# Patient Record
Sex: Male | Born: 1975 | Race: White | Hispanic: No | Marital: Married | Smoking: Current every day smoker
Health system: Southern US, Community
[De-identification: ages and names within clinical notes are randomized; demographics above are authoritative.]

## PROBLEM LIST (undated history)

## (undated) HISTORY — PX: NO PAST SURGERIES: SHX2092

---

## 2017-02-12 ENCOUNTER — Encounter: Payer: Self-pay | Admitting: Family Medicine

## 2017-02-12 ENCOUNTER — Ambulatory Visit (INDEPENDENT_AMBULATORY_CARE_PROVIDER_SITE_OTHER): Payer: BC Managed Care – PPO | Admitting: Family Medicine

## 2017-02-12 VITALS — BP 133/82 | HR 57 | Temp 98.4°F | Resp 20 | Ht 72.25 in | Wt 206.0 lb

## 2017-02-12 DIAGNOSIS — R030 Elevated blood-pressure reading, without diagnosis of hypertension: Secondary | ICD-10-CM | POA: Diagnosis not present

## 2017-02-12 DIAGNOSIS — Z7689 Persons encountering health services in other specified circumstances: Secondary | ICD-10-CM

## 2017-02-12 DIAGNOSIS — Z13220 Encounter for screening for lipoid disorders: Secondary | ICD-10-CM

## 2017-02-12 DIAGNOSIS — Z0000 Encounter for general adult medical examination without abnormal findings: Secondary | ICD-10-CM | POA: Diagnosis not present

## 2017-02-12 DIAGNOSIS — Z131 Encounter for screening for diabetes mellitus: Secondary | ICD-10-CM

## 2017-02-12 DIAGNOSIS — Z114 Encounter for screening for human immunodeficiency virus [HIV]: Secondary | ICD-10-CM | POA: Diagnosis not present

## 2017-02-12 NOTE — Patient Instructions (Signed)

## 2017-02-12 NOTE — Progress Notes (Signed)
Patient ID: [name], male  DOB: 1976-01-20, 41 y.o.   MRN: [id number] Patient Care Team    Relationship Specialty Notifications Start End  Ma Hillock, DO PCP - General Family Medicine  02/12/17     Chief Complaint  Patient presents with  . Establish Care    Subjective:  Richard Cutting is a 41 y.o.  male present for new patient establishment. All past medical history, surgical history, allergies, family history, immunizations, medications and social history were obtained and entered in the electronic medical record today. All recent labs, ED visits and hospitalizations within the last year were reviewed.  Patient has never been established with your primary care physician. He reports many muscle skeletal injuries, but no surgeries.  Health maintenance:  Colonoscopy: Fhx in mother in her 76s. Screen at 50.  Immunizations: tdap 2014 UTD, Influenza (encouraged yearly) Infectious disease screening: HIV agreeable today PSA: No Fhx, screen at 50 discussion.  Assistive device: None Oxygen use: None Patient has a Dental home. Hospitalizations/ED visits: None  Depression screen [facility] 2/9 02/12/2017  Decreased Interest 0  Down, Depressed, Hopeless 0  PHQ - 2 Score 0   No flowsheet data found.    Current Exercise Habits: The patient has a physically strenous job, but has no regular exercise apart from work. Exercise limited by: None identified Fall Risk  02/12/2017  Falls in the past year? No      There is no immunization history on file for this patient.  No exam data present  History reviewed. No pertinent past medical history. Allergies  Allergen Reactions  . Morphine And Related     anger   History reviewed. No pertinent surgical history. Family History  Problem Relation Age of Onset  . Ovarian cancer Mother   . Colon cancer Mother   . Heart disease Mother   . Bladder Cancer Father   . Hearing loss Father   . Heart disease Father   . Early death  Father   . Breast cancer Maternal Grandmother   . Arthritis Maternal Grandmother   . Mental retardation Maternal Grandmother   . Heart disease Maternal Grandmother   . Hearing loss Maternal Grandmother   . Alcohol abuse Maternal Grandfather   . Mental retardation Maternal Grandfather   . Early death Maternal Grandfather   . Arthritis Paternal Grandmother   . Heart disease Paternal Grandfather    Social History   Social History  . Marital status: Married    Spouse name: [name]  . Number of children: 2  . Years of education: tech school   Occupational History  . [employer] forwarder    Social History Main Topics  . Smoking status: Current Every Day Smoker    Packs/day: 1.00    Years: 26.00    Types: Cigarettes  . Smokeless tobacco: Never Used  . Alcohol use 1.2 oz/week    2 Cans of beer per week  . Drug use: No  . Sexual activity: Yes    Partners: Female   Other Topics Concern  . Not on file   Social History Narrative   Married to [place]. They have 3 children [name], [name].   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   Drinks caffeine, takes herbal remedies, takes a daily vitamin.   Wear seatbelt, smoke detector in the home.   Exercises routinely.   Has a partial plate upper front teeth.   Feels safe in his relationships.   Allergies as of  02/12/2017      Reactions   Morphine And Related    anger      Medication List    as of 02/12/2017 11:59 PM   You have not been prescribed any medications.     All past medical history, surgical history, allergies, family history, immunizations andmedications were updated in the EMR today and reviewed under the history and medication portions of their EMR.    No results found for this or any previous visit (from the past 2160 hour(s)).  Patient was never admitted.   ROS: 14 pt review of systems performed and negative (unless mentioned in an HPI)  Objective: BP 133/82 (BP Location: Right Arm, Patient  Position: Sitting, Cuff Size: Normal)   Pulse (!) 57   Temp 98.4 F (36.9 C)   Resp 20   Ht 6' 0.25" (1.835 m)   Wt 206 lb (93.4 kg)   SpO2 96%   BMI 27.75 kg/m  Gen: Afebrile. No acute distress. Nontoxic in appearance, well-developed, well-nourished, Pleasant Caucasian male. HENT: AT. [place]. Bilateral TM visualized and normal in appearance, normal external auditory canal. MMM, no oral lesions, adequate dentition. Bilateral nares within normal limits. Throat without erythema, ulcerations or exudates. No Cough on exam, no hoarseness on exam. Eyes:Pupils Equal Round Reactive to light, Extraocular movements intact,  Conjunctiva without redness, discharge or icterus. Neck/lymp/endocrine: Supple, no lymphadenopathy, no thyromegaly CV: RRR no murmur, no edema, +2/4 P posterior tibialis pulses. No carotid bruits. No JVD. Chest: CTAB, no wheeze, rhonchi or crackles. Normal Respiratory effort. Good Air movement. Abd: Soft. Flat. NTND. BS present. No Masses palpated. No hepatosplenomegaly. No rebound tenderness or guarding. Skin: No rashes, purpura or petechiae. Warm and well-perfused. Skin intact. Neuro/Msk:  Normal gait. PERLA. EOMi. Alert. Oriented x3.  Cranial nerves II through XII intact. Muscle strength 5/5 upper/lower extremity.  Psych: Normal affect, dress and demeanor. Normal speech. Normal thought content and judgment.   Assessment/plan: Alvis Edgell is a 41 y.o. male present for establish care.  Encounter for preventive health examination Patient was encouraged to exercise greater than 150 minutes a week. Patient was encouraged to choose a diet filled with fresh fruits and vegetables, and lean meats. AVS provided to patient today for education/recommendation on gender specific health and safety maintenance Elevated BP without diagnosis of hypertension - Patient has never checked blood pressure before. Initial blood pressure elevated at 152/93, repeat within normal range. - Monitor  sodium, exercise. - CBC w/Diff; Future - Comp Met (CMET); Future - Hemoglobin A1c; Future - TSH; Future Encounter for screening for HIV - Patient agreeable to testing today - HIV antibody (with reflex); Future Screening cholesterol level - Lipid panel; Future Diabetes mellitus screening - Hemoglobin A1c; Future . Return in about 1 year (around 02/12/2018) for CPE.   Note is dictated utilizing voice recognition software. Although note has been proof read prior to signing, occasional typographical errors still can be missed. If any questions arise, please do not hesitate to call for verification.  Electronically signed by: Howard Pouch, DO [place]

## 2017-02-13 ENCOUNTER — Encounter: Payer: Self-pay | Admitting: Family Medicine

## 2017-02-21 ENCOUNTER — Other Ambulatory Visit (INDEPENDENT_AMBULATORY_CARE_PROVIDER_SITE_OTHER): Payer: BC Managed Care – PPO

## 2017-02-21 DIAGNOSIS — R030 Elevated blood-pressure reading, without diagnosis of hypertension: Secondary | ICD-10-CM | POA: Diagnosis not present

## 2017-02-21 DIAGNOSIS — Z13220 Encounter for screening for lipoid disorders: Secondary | ICD-10-CM | POA: Diagnosis not present

## 2017-02-21 DIAGNOSIS — Z114 Encounter for screening for human immunodeficiency virus [HIV]: Secondary | ICD-10-CM

## 2017-02-21 DIAGNOSIS — Z131 Encounter for screening for diabetes mellitus: Secondary | ICD-10-CM

## 2017-02-21 LAB — CBC WITH DIFFERENTIAL/PLATELET
BASOS ABS: 0.1 10*3/uL (ref 0.0–0.1)
Basophils Relative: 0.8 % (ref 0.0–3.0)
EOS ABS: 0.2 10*3/uL (ref 0.0–0.7)
Eosinophils Relative: 3 % (ref 0.0–5.0)
HCT: 50.4 % (ref 39.0–52.0)
Hemoglobin: 16.9 g/dL (ref 13.0–17.0)
LYMPHS ABS: 1.8 10*3/uL (ref 0.7–4.0)
Lymphocytes Relative: 22.6 % (ref 12.0–46.0)
MCHC: 33.4 g/dL (ref 30.0–36.0)
MCV: 94 fl (ref 78.0–100.0)
Monocytes Absolute: 0.6 10*3/uL (ref 0.1–1.0)
Monocytes Relative: 7.7 % (ref 3.0–12.0)
NEUTROS ABS: 5.4 10*3/uL (ref 1.4–7.7)
NEUTROS PCT: 65.9 % (ref 43.0–77.0)
PLATELETS: 272 10*3/uL (ref 150.0–400.0)
RBC: 5.36 Mil/uL (ref 4.22–5.81)
RDW: 12.3 % (ref 11.5–15.5)
WBC: 8.1 10*3/uL (ref 4.0–10.5)

## 2017-02-21 LAB — COMPREHENSIVE METABOLIC PANEL
ALT: 22 U/L (ref 0–53)
AST: 17 U/L (ref 0–37)
Albumin: 4.9 g/dL (ref 3.5–5.2)
Alkaline Phosphatase: 65 U/L (ref 39–117)
BUN: 18 mg/dL (ref 6–23)
CHLORIDE: 105 meq/L (ref 96–112)
CO2: 30 mEq/L (ref 19–32)
CREATININE: 1.1 mg/dL (ref 0.40–1.50)
Calcium: 9.7 mg/dL (ref 8.4–10.5)
GFR: 78.27 mL/min (ref >60.00)
Glucose, Bld: 97 mg/dL (ref 70–99)
POTASSIUM: 5.6 meq/L — AB (ref 3.5–5.1)
SODIUM: 140 meq/L (ref 135–145)
TOTAL PROTEIN: 6.9 g/dL (ref 6.0–8.3)
Total Bilirubin: 0.6 mg/dL (ref 0.2–1.2)

## 2017-02-21 LAB — LIPID PANEL
CHOLESTEROL: 184 mg/dL (ref 0–200)
HDL: 43.7 mg/dL (ref >39.00)
LDL CALC: 120 mg/dL — AB (ref 0–99)
NonHDL: 140.15
TRIGLYCERIDES: 99 mg/dL (ref 0.0–149.0)
Total CHOL/HDL Ratio: 4
VLDL: 19.8 mg/dL (ref 0.0–40.0)

## 2017-02-21 LAB — TSH: TSH: 0.85 u[IU]/mL (ref 0.35–4.50)

## 2017-02-22 LAB — HEMOGLOBIN A1C
HEMOGLOBIN A1C: 4.7 % (ref <5.7)
Mean Plasma Glucose: 88 mg/dL

## 2017-02-22 LAB — HIV ANTIBODY (ROUTINE TESTING W REFLEX): HIV 1&2 Ab, 4th Generation: NONREACTIVE

## 2017-02-24 ENCOUNTER — Telehealth: Payer: Self-pay | Admitting: Family Medicine

## 2017-02-24 NOTE — Telephone Encounter (Signed)
Patient notified and verbalized understanding. 

## 2017-02-24 NOTE — Telephone Encounter (Signed)
Please call pt: - his labs look good. His potassium is mildly elevated. If he is taking a potassium supplement or using salt substitute (which can be high in potassium), please have him cut back on use.

## 2017-09-30 ENCOUNTER — Ambulatory Visit: Payer: BC Managed Care – PPO | Admitting: Family Medicine

## 2017-10-09 ENCOUNTER — Encounter: Payer: Self-pay | Admitting: Family Medicine

## 2017-10-09 ENCOUNTER — Ambulatory Visit: Payer: BC Managed Care – PPO | Admitting: Family Medicine

## 2017-10-09 VITALS — BP 117/73 | HR 62 | Temp 97.5°F | Ht 72.25 in | Wt 208.0 lb

## 2017-10-09 DIAGNOSIS — F17200 Nicotine dependence, unspecified, uncomplicated: Secondary | ICD-10-CM | POA: Diagnosis not present

## 2017-10-09 DIAGNOSIS — G8929 Other chronic pain: Secondary | ICD-10-CM | POA: Diagnosis not present

## 2017-10-09 DIAGNOSIS — Z716 Tobacco abuse counseling: Secondary | ICD-10-CM | POA: Diagnosis not present

## 2017-10-09 DIAGNOSIS — G4700 Insomnia, unspecified: Secondary | ICD-10-CM

## 2017-10-09 DIAGNOSIS — M25521 Pain in right elbow: Secondary | ICD-10-CM

## 2017-10-09 MED ORDER — BUPROPION HCL ER (SR) 150 MG PO TB12: ORAL_TABLET | ORAL | 0 refills | Status: DC

## 2017-10-09 NOTE — Progress Notes (Signed)
[name] , 07/14/1976, 42 y.o., male MRN: [id number] Patient Care Team    Relationship Specialty Notifications Start End  [name], Renee A, DO PCP - General Family Medicine  02/12/17     Chief Complaint  Patient presents with  . Nicotine Dependence    Pt is here to know how to quit smoking     Subjective: Pt presents for an OV with complaints of wanting to quit smoking. Patient reports he successfully quit the past without any medication assistance. However he is here today to be supportive because his significant other once to quit smoking as well.  Patient reports currently smokes about a half-pack a day sometimes up to 1 pack a day, full flavored. He attempts to use toothpicks and other behavior mechanisms to help him through cravings. He is agreeable to try a oral medication, but does not want to try nicotine gum or patches.  Right elbow tendinitis: Patient reports over the last 8 months he has had discomfort in his right elbow. He states it usually hurts to touch and to make some movements. He has tried to take Aleve which only helped very temporarily.  Depression screen PHQ 2/9 02/12/2017  Decreased Interest 0  Down, Depressed, Hopeless 0  PHQ - 2 Score 0    Allergies  Allergen Reactions  . Morphine And Related     anger   Social History   Tobacco Use  . Smoking status: Current Every Day Smoker    Packs/day: 1.00    Years: 26.00    Pack years: 26.00    Types: Cigarettes  . Smokeless tobacco: Never Used  Substance Use Topics  . Alcohol use: Yes    Alcohol/week: 1.2 oz    Types: 2 Cans of beer per week   History reviewed. No pertinent past medical history. History reviewed. No pertinent surgical history. Family History  Problem Relation Age of Onset  . Ovarian cancer Mother   . Colon cancer Mother   . Heart disease Mother   . Bladder Cancer Father   . Hearing loss Father   . Heart disease Father   . Early death Father   . Breast cancer Maternal  Grandmother   . Arthritis Maternal Grandmother   . Mental retardation Maternal Grandmother   . Heart disease Maternal Grandmother   . Hearing loss Maternal Grandmother   . Alcohol abuse Maternal Grandfather   . Mental retardation Maternal Grandfather   . Early death Maternal Grandfather   . Arthritis Paternal Grandmother   . Heart disease Paternal Grandfather    Allergies as of 10/09/2017      Reactions   Morphine And Related    anger      Medication List    as of 10/09/2017  9:48 AM   You have not been prescribed any medications.     All past medical history, surgical history, allergies, family history, immunizations andmedications were updated in the EMR today and reviewed under the history and medication portions of their EMR.     ROS: Negative, with the exception of above mentioned in HPI   Objective:  BP 117/73 (BP Location: Left Arm, Patient Position: Sitting, Cuff Size: Large)   Pulse 62   Temp (!) 97.5 F (36.4 C) (Oral)   Ht 6' 0.25" (1.835 m)   Wt 208 lb (94.3 kg)   SpO2 97%   BMI 28.01 kg/m  Body mass index is 28.01 kg/m. Gen: Afebrile. No acute distress. Nontoxic in appearance, well developed,  well nourished.  HENT: AT. [place].MMM, no oral lesions. Eyes:Pupils Equal Round Reactive to light, Extraocular movements intact,  Conjunctiva without redness, discharge or icterus. CV: RRR no murmur, no edema Chest: CTAB, no wheeze or crackles. Good air movement, normal resp effort.  MSK: No erythema, no soft tissue swelling. Mild tenderness to palpation right lateral epicondyle area of elbow. Mild reproduction of discomfort with resisted supination. Neurologically intact distally. Skin: No rashes, purpura or petechiae.  Psych: Normal affect, dress and demeanor. Normal speech. Normal thought content and judgment.  No exam data present No results found. No results found for this or any previous visit (from the past 24 hour(s)).  Assessment/Plan: [name] is a 42  y.o. male present for OV for  Tobacco use disorder Encounter for smoking cessation counseling Discussed multiple options for his smoking cessation. Patient was instructed on Zyban/Wellbutrin use. Taper over 3 days to twice a day. During first 2 weeks sees patient should be cutting back on his daily cigarette consumption. Approximately on day 14 of medication use should be his quit date. Expect to use medication for 3 months. If patient still using medication at 4 weeks, he will call and for refills at that time. Okay to refill for #60, 1 refill.   Insomnia, unspecified type Briefly discussed today. He has tried over-the-counter sleep aids. Patient encouraged to see how Wellbutrin affects his sleep pattern. If needing to discuss insomnia in more detail he needs to follow-up with an appointment for this issue.  Elbow pain, chronic, right Mild tendinitis on exam today. Encourage the use of a tendinitis elbow strap. Aleve as needed. Discuss with him if does not respond to this then he would need a sports medicine referral and consider cortisone injection.   Reviewed expectations re: course of current medical issues.  Discussed self-management of symptoms.  Outlined signs and symptoms indicating need for more acute intervention.  Patient verbalized understanding and all questions were answered.  Patient received an After-Visit Summary.    No orders of the defined types were placed in this encounter.    Note is dictated utilizing voice recognition software. Although note has been proof read prior to signing, occasional typographical errors still can be missed. If any questions arise, please do not hesitate to call for verification.   electronically signed by:  Felix Pacini, DO  [facility] [place]

## 2017-10-09 NOTE — Patient Instructions (Addendum)
Start zyban once a day for 3 days, then every 12 hours. In 2 weeks stop all smoking., throw them out.  If still using in 4 weeks, call in and we will refill for you.  Recommendations are 12 weeks.     Smoking Tobacco Information Smoking tobacco will very likely harm your health. Tobacco contains a poisonous (toxic), colorless chemical called nicotine. Nicotine affects the brain and makes tobacco addictive. This change in your brain can make it hard to stop smoking. Tobacco also has other toxic chemicals that can hurt your body and raise your risk of many cancers. How can smoking tobacco affect me? Smoking tobacco can increase your chances of having serious health conditions, such as:  Cancer. Smoking is most commonly associated with lung cancer, but can lead to cancer in other parts of the body.  Chronic obstructive pulmonary disease (COPD). This is a long-term lung condition that makes it hard to breathe. It also gets worse over time.  High blood pressure (hypertension), heart disease, stroke, or heart attack.  Lung infections, such as pneumonia.  Cataracts. This is when the lenses in the eyes become clouded.  Digestive problems. This may include peptic ulcers, heartburn, and gastroesophageal reflux disease (GERD).  Oral health problems, such as gum disease and tooth loss.  Loss of taste and smell.  Smoking can affect your appearance by causing:  Wrinkles.  Yellow or stained teeth, fingers, and fingernails.  Smoking tobacco can also affect your social life.  Many workplaces, [employer], hotels, and public places are tobacco-free. This means that you may experience challenges in finding places to smoke when away from home.  The cost of a smoking habit can be expensive. Expenses for someone who smokes come in two ways: ? You spend money on a regular basis to buy tobacco. ? Your health care costs in the long-term are higher if you smoke.  Tobacco smoke can also affect the  health of those around you. Children of smokers have greater chances of: ? Sudden infant death syndrome (SIDS). ? Ear infections. ? Lung infections.  What lifestyle changes can be made?  Do not start smoking. Quit if you already do.  To quit smoking: ? Make a plan to quit smoking and commit yourself to it. Look for programs to help you and ask your health care provider for recommendations and ideas. ? Talk with your health care provider about using nicotine replacement medicines to help you quit. Medicine replacement medicines include gum, lozenges, patches, sprays, or pills. ? Do not replace cigarette smoking with electronic cigarettes, which are commonly called e-cigarettes. The safety of e-cigarettes is not known, and some may contain harmful chemicals. ? Avoid places, people, or situations that tempt you to smoke. ? If you try to quit but return to smoking, don't give up hope. It is very common for people to try a number of times before they fully succeed. When you feel ready again, give it another try.  Quitting smoking might affect the way you eat as well as your weight. Be prepared to monitor your eating habits. Get support in planning and following a healthy diet.  Ask your health care provider about having regular tests (screenings) to check for cancer. This may include blood tests, imaging tests, and other tests.  Exercise regularly. Consider taking walks, joining a gym, or doing yoga or exercise classes.  Develop skills to manage your stress. These skills include meditation. What are the benefits of quitting smoking? By quitting smoking, you may:  Lower your risk of getting cancer and other diseases caused by smoking.  Live longer.  Breathe better.  Lower your blood pressure and heart rate.  Stop your addiction to tobacco.  Stop creating secondhand smoke that hurts other people.  Improve your sense of taste and smell.  Look better over time, due to having fewer  wrinkles and less staining.  What can happen if changes are not made? If you do not stop smoking, you may:  Get cancer and other diseases.  Develop COPD or other long-term (chronic) lung conditions.  Develop serious problems with your heart and blood vessels (cardiovascular system).  Need more tests to screen for problems caused by smoking.  Have higher, long-term healthcare costs from medicines or treatments related to smoking.  Continue to have worsening changes in your lungs, mouth, and nose.  Where to find support: To get support to quit smoking, consider:  Asking your health care provider for more information and resources.  Taking classes to learn more about quitting smoking.  Looking for local organizations that offer resources about quitting smoking.  Joining a support group for people who want to quit smoking in your local community.  Where to find more information: You may find more information about quitting smoking from:  HelpGuide.org: www.helpguide.org/articles/addictions/how-to-quit-smoking.htm  [employer]: smokefree.gov  American Lung Association: www.lung.org  Contact a health care provider if:  You have problems breathing.  Your lips, nose, or fingers turn blue.  You have chest pain.  You are coughing up blood.  You feel faint or you pass out.  You have other noticeable changes that cause you to worry. Summary  Smoking tobacco can negatively affect your health, the health of those around you, your finances, and your social life.  Do not start smoking. Quit if you already do. If you need help quitting, ask your health care provider.  Think about joining a support group for people who want to quit smoking in your local community. There are many effective programs that will help you to quit this behavior. This information is not intended to replace advice given to you by your health care provider. Make sure you discuss any questions you have  with your health care provider. Document Released: 08/06/2016 Document Revised: 08/06/2016 Document Reviewed: 08/06/2016 Elsevier Interactive Patient Education  2018 [employer]. Insomnia Insomnia is a sleep disorder that makes it difficult to fall asleep or to stay asleep. Insomnia can cause tiredness (fatigue), low energy, difficulty concentrating, mood swings, and poor performance at work or school. There are three different ways to classify insomnia:  Difficulty falling asleep.  Difficulty staying asleep.  Waking up too early in the morning.  Any type of insomnia can be long-term (chronic) or short-term (acute). Both are common. Short-term insomnia usually lasts for three months or less. Chronic insomnia occurs at least three times a week for longer than three months. What are the causes? Insomnia may be caused by another condition, situation, or substance, such as:  Anxiety.  Certain medicines.  Gastroesophageal reflux disease (GERD) or other gastrointestinal conditions.  Asthma or other breathing conditions.  Restless legs syndrome, sleep apnea, or other sleep disorders.  Chronic pain.  Menopause. This may include hot flashes.  Stroke.  Abuse of alcohol, tobacco, or illegal drugs.  Depression.  Caffeine.  Neurological disorders, such as Alzheimer disease.  An overactive thyroid (hyperthyroidism).  The cause of insomnia may not be known. What increases the risk? Risk factors for insomnia include:  Gender. Women are more commonly  affected than men.  Age. Insomnia is more common as you get older.  Stress. This may involve your professional or personal life.  Income. Insomnia is more common in people with lower income.  Lack of exercise.  Irregular work schedule or night shifts.  Traveling between different time zones.  What are the signs or symptoms? If you have insomnia, trouble falling asleep or trouble staying asleep is the main symptom. This may  lead to other symptoms, such as:  Feeling fatigued.  Feeling nervous about going to sleep.  Not feeling rested in the morning.  Having trouble concentrating.  Feeling irritable, anxious, or depressed.  How is this treated? Treatment for insomnia depends on the cause. If your insomnia is caused by an underlying condition, treatment will focus on addressing the condition. Treatment may also include:  Medicines to help you sleep.  Counseling or therapy.  Lifestyle adjustments.  Follow these instructions at home:  Take medicines only as directed by your health care provider.  Keep regular sleeping and waking hours. Avoid naps.  Keep a sleep diary to help you and your health care provider figure out what could be causing your insomnia. Include: ? When you sleep. ? When you wake up during the night. ? How well you sleep. ? How rested you feel the next day. ? Any side effects of medicines you are taking. ? What you eat and drink.  Make your bedroom a comfortable place where it is easy to fall asleep: ? Put up shades or special blackout curtains to block light from outside. ? Use a white noise machine to block noise. ? Keep the temperature cool.  Exercise regularly as directed by your health care provider. Avoid exercising right before bedtime.  Use relaxation techniques to manage stress. Ask your health care provider to suggest some techniques that may work well for you. These may include: ? Breathing exercises. ? Routines to release muscle tension. ? Visualizing peaceful scenes.  Cut back on alcohol, caffeinated beverages, and cigarettes, especially close to bedtime. These can disrupt your sleep.  Do not overeat or eat spicy foods right before bedtime. This can lead to digestive discomfort that can make it hard for you to sleep.  Limit screen use before bedtime. This includes: ? Watching TV. ? Using your smartphone, tablet, and computer.  Stick to a routine. This can  help you fall asleep faster. Try to do a quiet activity, brush your teeth, and go to bed at the same time each night.  Get out of bed if you are still awake after 15 minutes of trying to sleep. Keep the lights down, but try reading or doing a quiet activity. When you feel sleepy, go back to bed.  Make sure that you drive carefully. Avoid driving if you feel very sleepy.  Keep all follow-up appointments as directed by your health care provider. This is important. Contact a health care provider if:  You are tired throughout the day or have trouble in your daily routine due to sleepiness.  You continue to have sleep problems or your sleep problems get worse. Get help right away if:  You have serious thoughts about hurting yourself or someone else. This information is not intended to replace advice given to you by your health care provider. Make sure you discuss any questions you have with your health care provider. Document Released: 07/19/2000 Document Revised: 12/22/2015 Document Reviewed: 04/22/2014 Elsevier Interactive Patient Education  2018 [employer]  Tennis Elbow Tennis elbow is  puffiness (inflammation) of the outer tendons of your forearm close to your elbow. Your tendons attach your muscles to your bones. Tennis elbow can happen in any sport or job in which you use your elbow too much. It is caused by doing the same motion over and over. Tennis elbow can cause:  Pain and tenderness in your forearm and the outer part of your elbow.  A burning feeling. This runs from your elbow through your arm.  Weak grip in your hands.  Follow these instructions at home: Activity  Rest your elbow and wrist as told by your doctor. Try to avoid any activities that caused the problem until your doctor says that you can do them again.  If a physical therapist teaches you exercises, do all of them as told.  If you lift an object, lift it with your palm facing up. This is easier on your  elbow. Lifestyle  If your tennis elbow is caused by sports, check your equipment and make sure that: ? You are using it correctly. ? It fits you well.  If your tennis elbow is caused by work, take breaks often, if you are able. Talk with your manager about doing your work in a way that is safe for you. ? If your tennis elbow is caused by computer use, talk with your manager about any changes that can be made to your work setup. General instructions  If told, apply ice to the painful area: ? Put ice in a plastic bag. ? Place a towel between your skin and the bag. ? Leave the ice on for 20 minutes, 2-3 times per day.  Take medicines only as told by your doctor.  If you were given a brace, wear it as told by your doctor.  Keep all follow-up visits as told by your doctor. This is important. Contact a doctor if:  Your pain does not get better with treatment.  Your pain gets worse.  You have weakness in your forearm, hand, or fingers.  You cannot feel your forearm, hand, or fingers. This information is not intended to replace advice given to you by your health care provider. Make sure you discuss any questions you have with your health care provider. Document Released: 01/09/2010 Document Revised: 03/21/2016 Document Reviewed: 07/18/2014 Elsevier Interactive Patient Education  [employer].

## 2017-10-10 ENCOUNTER — Encounter: Payer: Self-pay | Admitting: Family Medicine

## 2017-11-10 ENCOUNTER — Other Ambulatory Visit: Payer: Self-pay | Admitting: *Deleted

## 2017-11-10 MED ORDER — BUPROPION HCL ER (SR) 150 MG PO TB12: 150.0000 mg | ORAL_TABLET | Freq: Two times a day (BID) | ORAL | 1 refills | Status: DC

## 2018-02-13 ENCOUNTER — Ambulatory Visit (INDEPENDENT_AMBULATORY_CARE_PROVIDER_SITE_OTHER): Payer: BC Managed Care – PPO | Admitting: Family Medicine

## 2018-02-13 ENCOUNTER — Encounter: Payer: Self-pay | Admitting: Family Medicine

## 2018-02-13 VITALS — BP 120/78 | HR 58 | Temp 98.0°F | Resp 16 | Ht 72.0 in | Wt 200.0 lb

## 2018-02-13 DIAGNOSIS — E875 Hyperkalemia: Secondary | ICD-10-CM | POA: Insufficient documentation

## 2018-02-13 DIAGNOSIS — Z131 Encounter for screening for diabetes mellitus: Secondary | ICD-10-CM

## 2018-02-13 DIAGNOSIS — R0789 Other chest pain: Secondary | ICD-10-CM

## 2018-02-13 DIAGNOSIS — Z130 Encounter for screening for diseases of the blood and blood-forming organs and certain disorders involving the immune mechanism: Secondary | ICD-10-CM

## 2018-02-13 DIAGNOSIS — E663 Overweight: Secondary | ICD-10-CM | POA: Insufficient documentation

## 2018-02-13 DIAGNOSIS — Z0000 Encounter for general adult medical examination without abnormal findings: Secondary | ICD-10-CM | POA: Diagnosis not present

## 2018-02-13 DIAGNOSIS — F17200 Nicotine dependence, unspecified, uncomplicated: Secondary | ICD-10-CM | POA: Diagnosis not present

## 2018-02-13 LAB — BASIC METABOLIC PANEL
BUN: 15 mg/dL (ref 6–23)
CALCIUM: 9.4 mg/dL (ref 8.4–10.5)
CO2: 29 mEq/L (ref 19–32)
CREATININE: 1.06 mg/dL (ref 0.40–1.50)
Chloride: 105 mEq/L (ref 96–112)
GFR: 81.3 mL/min (ref >60.00)
GLUCOSE: 99 mg/dL (ref 70–99)
Potassium: 5.1 mEq/L (ref 3.5–5.1)
Sodium: 140 mEq/L (ref 135–145)

## 2018-02-13 LAB — LIPID PANEL
CHOL/HDL RATIO: 4
Cholesterol: 193 mg/dL (ref 0–200)
HDL: 43.8 mg/dL (ref >39.00)
LDL Cholesterol: 129 mg/dL — ABNORMAL HIGH (ref 0–99)
NONHDL: 149.19
Triglycerides: 103 mg/dL (ref 0.0–149.0)
VLDL: 20.6 mg/dL (ref 0.0–40.0)

## 2018-02-13 LAB — CBC
HEMATOCRIT: 48.3 % (ref 39.0–52.0)
Hemoglobin: 16.6 g/dL (ref 13.0–17.0)
MCHC: 34.4 g/dL (ref 30.0–36.0)
MCV: 91.2 fl (ref 78.0–100.0)
PLATELETS: 277 10*3/uL (ref 150.0–400.0)
RBC: 5.29 Mil/uL (ref 4.22–5.81)
RDW: 12.6 % (ref 11.5–15.5)
WBC: 9.1 10*3/uL (ref 4.0–10.5)

## 2018-02-13 LAB — HEMOGLOBIN A1C: HEMOGLOBIN A1C: 5.4 % (ref 4.6–6.5)

## 2018-02-13 NOTE — Patient Instructions (Addendum)
Health Maintenance, Male A healthy lifestyle and preventive care is important for your health and wellness. Ask your health care provider about what schedule of regular examinations is right for you. What should I know about weight and diet? Eat a Healthy Diet  Eat plenty of vegetables, fruits, whole grains, low-fat dairy products, and lean protein.  Do not eat a lot of foods high in solid fats, added sugars, or salt.  Maintain a Healthy Weight Regular exercise can help you achieve or maintain a healthy weight. You should:  Do at least 150 minutes of exercise each week. The exercise should increase your heart rate and make you sweat (moderate-intensity exercise).  Do strength-training exercises at least twice a week.  Watch Your Levels of Cholesterol and Blood Lipids  Have your blood tested for lipids and cholesterol every 5 years starting at 42 years of age. If you are at high risk for heart disease, you should start having your blood tested when you are 42 years old. You may need to have your cholesterol levels checked more often if: ? Your lipid or cholesterol levels are high. ? You are older than 42 years of age. ? You are at high risk for heart disease.  What should I know about cancer screening? Many types of cancers can be detected early and may often be prevented. Lung Cancer  You should be screened every year for lung cancer if: ? You are a current smoker who has smoked for at least 30 years. ? You are a former smoker who has quit within the past 15 years.  Talk to your health care provider about your screening options, when you should start screening, and how often you should be screened.  Colorectal Cancer  Routine colorectal cancer screening usually begins at 42 years of age and should be repeated every 5-10 years until you are 42 years old. You may need to be screened more often if early forms of precancerous polyps or small growths are found. Your health care provider  may recommend screening at an earlier age if you have risk factors for colon cancer.  Your health care provider may recommend using home test kits to check for hidden blood in the stool.  A small camera at the end of a tube can be used to examine your colon (sigmoidoscopy or colonoscopy). This checks for the earliest forms of colorectal cancer.  Prostate and Testicular Cancer  Depending on your age and overall health, your health care provider may do certain tests to screen for prostate and testicular cancer.  Talk to your health care provider about any symptoms or concerns you have about testicular or prostate cancer.  Skin Cancer  Check your skin from head to toe regularly.  Tell your health care provider about any new moles or changes in moles, especially if: ? There is a change in a mole's size, shape, or color. ? You have a mole that is larger than a pencil eraser.  Always use sunscreen. Apply sunscreen liberally and repeat throughout the day.  Protect yourself by wearing long sleeves, pants, a wide-brimmed hat, and sunglasses when outside.  What should I know about heart disease, diabetes, and high blood pressure?  If you are 18-39 years of age, have your blood pressure checked every 3-5 years. If you are 40 years of age or older, have your blood pressure checked every year. You should have your blood pressure measured twice-once when you are at a hospital or clinic, and once when   you are not at a hospital or clinic. Record the average of the two measurements. To check your blood pressure when you are not at a hospital or clinic, you can use: ? An automated blood pressure machine at a pharmacy. ? A home blood pressure monitor.  Talk to your health care provider about your target blood pressure.  If you are between 45-79 years old, ask your health care provider if you should take aspirin to prevent heart disease.  Have regular diabetes screenings by checking your fasting blood  sugar level. ? If you are at a normal weight and have a low risk for diabetes, have this test once every three years after the age of 45. ? If you are overweight and have a high risk for diabetes, consider being tested at a younger age or more often.  A one-time screening for abdominal aortic aneurysm (AAA) by ultrasound is recommended for men aged 65-75 years who are current or former smokers. What should I know about preventing infection? Hepatitis B If you have a higher risk for hepatitis B, you should be screened for this virus. Talk with your health care provider to find out if you are at risk for hepatitis B infection. Hepatitis C Blood testing is recommended for:  Everyone born from 1945 through 1965.  Anyone with known risk factors for hepatitis C.  Sexually Transmitted Diseases (STDs)  You should be screened each year for STDs including gonorrhea and chlamydia if: ? You are sexually active and are younger than 42 years of age. ? You are older than 42 years of age and your health care provider tells you that you are at risk for this type of infection. ? Your sexual activity has changed since you were last screened and you are at an increased risk for chlamydia or gonorrhea. Ask your health care provider if you are at risk.  Talk with your health care provider about whether you are at high risk of being infected with HIV. Your health care provider may recommend a prescription medicine to help prevent HIV infection.  What else can I do?  Schedule regular health, dental, and eye exams.  Stay current with your vaccines (immunizations).  Do not use any tobacco products, such as cigarettes, chewing tobacco, and e-cigarettes. If you need help quitting, ask your health care provider.  Limit alcohol intake to no more than 2 drinks per day. One drink equals 12 ounces of beer, 5 ounces of wine, or 1 ounces of hard liquor.  Do not use street drugs.  Do not share needles.  Ask your  health care provider for help if you need support or information about quitting drugs.  Tell your health care provider if you often feel depressed.  Tell your health care provider if you have ever been abused or do not feel safe at home. This information is not intended to replace advice given to you by your health care provider. Make sure you discuss any questions you have with your health care provider. Document Released: 01/18/2008 Document Revised: 03/20/2016 Document Reviewed: 04/25/2015 Elsevier Interactive Patient Education  2018 Elsevier Inc[employer]    Please help us help you:  We are honored you have chosen [facility] for your Primary Care home. Below you will find basic instructions that you may need to access in the future. Please help us help you by reading the instructions, which cover many of the frequent questions we experience.   Prescription refills and request:  -In order   to allow more efficient response time, please call your pharmacy for all refills. They will forward the request electronically to us. This allows for the quickest possible response. Request left on a nurse line can take longer to refill, since these are checked as time allows between office patients and other phone calls.  - refill request can take up to 3-5 working days to complete.  - If request is sent electronically and request is appropiate, it is usually completed in 1-2 business days.  - all patients will need to be seen routinely for all chronic medical conditions requiring prescription medications (see follow-up below). If you are overdue for follow up on your condition, you will be asked to make an appointment and we will call in enough medication to cover you until your appointment (up to 30 days).  - all controlled substances will require a face to face visit to request/refill.  - if you desire your prescriptions to go through a new pharmacy, and have an active script at original pharmacy, you will  need to call your pharmacy and have scripts transferred to new pharmacy. This is completed between the pharmacy locations and not by your provider.    Results: If any images or labs were ordered, it can take up to 1 week to get results depending on the test ordered and the lab/facility running and resulting the test. - Normal or stable results, which do not need further discussion, may be released to your mychart immediately with attached note to you. A call may not be generated for normal results. Please make certain to sign up for mychart. If you have questions on how to activate your mychart you can call the front office.  - If your results need further discussion, our office will attempt to contact you via phone, and if unable to reach you after 2 attempts, we will release your abnormal result to your mychart with instructions.  - All results will be automatically released in mychart after 1 week.  - Your provider will provide you with explanation and instruction on all relevant material in your results. Please keep in mind, results and labs may appear confusing or abnormal to the untrained eye, but it does not mean they are actually abnormal for you personally. If you have any questions about your results that are not covered, or you desire more detailed explanation than what was provided, you should make an appointment with your provider to do so.   Our office handles many outgoing and incoming calls daily. If we have not contacted you within 1 week about your results, please check your mychart to see if there is a message first and if not, then contact our office.  In helping with this matter, you help decrease call volume, and therefore allow us to be able to respond to patients needs more efficiently.   Acute office visits (sick visit):  An acute visit is intended for a new problem and are scheduled in shorter time slots to allow schedule openings for patients with new problems. This is the  appropriate visit to discuss a new problem. Problems will not be addressed by phone call or Echart message. Appointment is needed if requesting treatment. In order to provide you with excellent quality medical care with proper time for you to explain your problem, have an exam and receive treatment with instructions, these appointments should be limited to one new problem per visit. If you experience a new problem, in which you desire to be   addressed, please make an acute office visit, we save openings on the schedule to accommodate you. Please do not save your new problem for any other type of visit, let us take care of it properly and quickly for you.   Follow up visits:  Depending on your condition(s) your provider will need to see you routinely in order to provide you with quality care and prescribe medication(s). Most chronic conditions (Example: hypertension, Diabetes, depression/anxiety... etc), require visits a couple times a year. Your provider will instruct you on proper follow up for your personal medical conditions and history. Please make certain to make follow up appointments for your condition as instructed. Failing to do so could result in lapse in your medication treatment/refills. If you request a refill, and are overdue to be seen on a condition, we will always provide you with a 30 day script (once) to allow you time to schedule.    Medicare wellness (well visit): - we have a wonderful Nurse (Kim), that will meet with you and provide you will yearly medicare wellness visits. These visits should occur yearly (can not be scheduled less than 1 calendar year apart) and cover preventive health, immunizations, advance directives and screenings you are entitled to yearly through your medicare benefits. Do not miss out on your entitled benefits, this is when medicare will pay for these benefits to be ordered for you.  These are strongly encouraged by your provider and is the appropriate type of  visit to make certain you are up to date with all preventive health benefits. If you have not had your medicare wellness exam in the last 12 months, please make certain to schedule one by calling the office and schedule your medicare wellness with Kim as soon as possible.   Yearly physical (well visit):  - Adults are recommended to be seen yearly for physicals. Check with your insurance and date of your last physical, most insurances require one calendar year between physicals. Physicals include all preventive health topics, screenings, medical exam and labs that are appropriate for gender/age and history. You may have fasting labs needed at this visit. This is a well visit (not a sick visit), new problems should not be covered during this visit (see acute visit).  - Pediatric patients are seen more frequently when they are younger. Your provider will advise you on well child visit timing that is appropriate for your their age. - This is not a medicare wellness visit. Medicare wellness exams do not have an exam portion to the visit. Some medicare companies allow for a physical, some do not allow a yearly physical. If your medicare allows a yearly physical you can schedule the medicare wellness with our nurse Kim and have your physical with your provider after, on the same day. Please check with insurance for your full benefits.   Late Policy/No Shows:  - all new patients should arrive 15-30 minutes earlier than appointment to allow us time  to  obtain all personal demographics,  insurance information and for you to complete office paperwork. - All established patients should arrive 10-15 minutes earlier than appointment time to update all information and be checked in .  - In our best efforts to run on time, if you are late for your appointment you will be asked to either reschedule or if able, we will work you back into the schedule. There will be a wait time to work you back in the schedule,  depending  on availability.  - If   you are unable to make it to your appointment as scheduled, please call 24 hours ahead of time to allow us to fill the time slot with someone else who needs to be seen. If you do not cancel your appointment ahead of time, you may be charged a no show fee.  

## 2018-02-13 NOTE — Progress Notes (Signed)
Patient ID: Harvard Zeiss, male  DOB: [birth year], 42 y.o.   MRN: [id number] Patient Care Team    Relationship Specialty Notifications Start End  Natalia Leatherwood, DO PCP - General Family Medicine  02/12/17     Chief Complaint  Patient presents with  . Annual Exam    Subjective:  [name] is a 42 y.o. male present for CPE. All past medical history, surgical history, allergies, family history, immunizations, medications and social history were updated in the electronic medical record today. All recent labs, ED visits and hospitalizations within the last year were reviewed.  Chest discomfort: pt reports intermittent chest discomfort, right side of his chest, sometimes in his right arm. He has had in the past as well. No left sided symptom, dyspnea on exertion, diaphoresis or nausea associated. He states it will be TTP on his right chest. His has Fhx heart disease, he is a smoker.   Health maintenance: updated 02/13/18 Colonoscopy: Fhx in mother in her 17s. Screen at 50.  Immunizations: tdap 2014 UTD, Influenza (encouraged yearly) Infectious disease screening: HIV acompleted PSA: No Fhx, screen at 50 discussion.  Assistive device: None Oxygen use: None Patient has a Dental home. Hospitalizations/ED visits: None  Depression screen [facility] 2/9 02/13/2018 02/13/2018 02/12/2017  Decreased Interest 0 0 0  Down, Depressed, Hopeless 0 0 0  PHQ - 2 Score 0 0 0  Altered sleeping 1 - -  Change in appetite 0 - -  Feeling bad or failure about yourself  0 - -  Trouble concentrating 0 - -  Moving slowly or fidgety/restless 0 - -  Suicidal thoughts 0 - -  PHQ-9 Score 1 - -  Difficult doing work/chores Not difficult at all - -   GAD 7 : Generalized Anxiety Score 02/13/2018  Nervous, Anxious, on Edge 1  Control/stop worrying 0  Worry too much - different things 0  Trouble relaxing 1  Restless 2  Easily annoyed or irritable 1  Afraid - awful might happen 0  Total GAD 7 Score 5    Anxiety Difficulty Not difficult at all     Current Exercise Habits: Home exercise routine, Type of exercise: strength training/weights;treadmill, Time (Minutes): 20, Frequency (Times/Week): 7, Weekly Exercise (Minutes/Week): 140, Intensity: Moderate Exercise limited by: cardiac condition(s) Fall Risk  02/13/2018 02/12/2017  Falls in the past year? No No    There is no immunization history on file for this patient.   History reviewed. No pertinent past medical history. Allergies  Allergen Reactions  . Morphine And Related     anger   Past Surgical History:  Procedure Laterality Date  . NO PAST SURGERIES     Family History  Problem Relation Age of Onset  . Ovarian cancer Mother   . Colon cancer Mother   . Heart disease Mother   . Bladder Cancer Father   . Hearing loss Father   . Heart disease Father   . Early death Father   . Breast cancer Maternal Grandmother   . Arthritis Maternal Grandmother   . Mental retardation Maternal Grandmother   . Heart disease Maternal Grandmother   . Hearing loss Maternal Grandmother   . Alcohol abuse Maternal Grandfather   . Mental retardation Maternal Grandfather   . Early death Maternal Grandfather   . Arthritis Paternal Grandmother   . Heart disease Paternal Grandfather    Social History   Socioeconomic History  . Marital status: Married    Spouse name: Fleet Contras  .  Number of children: 2  . Years of education: tech school  . Highest education level: Not on file  Occupational History  . Occupation: Scientist, research (medical)  Social Needs  . Financial resource strain: Not on file  . Food insecurity:    Worry: Not on file    Inability: Not on file  . Transportation needs:    Medical: Not on file    Non-medical: Not on file  Tobacco Use  . Smoking status: Current Every Day Smoker    Packs/day: 1.00    Years: 26.00    Pack years: 26.00    Types: Cigarettes  . Smokeless tobacco: Never Used  Substance and Sexual Activity  . Alcohol  use: Yes    Alcohol/week: 1.2 oz    Types: 2 Cans of beer per week  . Drug use: No  . Sexual activity: Yes    Partners: Female  Lifestyle  . Physical activity:    Days per week: Not on file    Minutes per session: Not on file  . Stress: Not on file  Relationships  . Social connections:    Talks on phone: Not on file    Gets together: Not on file    Attends religious service: Not on file    Active member of club or organization: Not on file    Attends meetings of clubs or organizations: Not on file    Relationship status: Not on file  . Intimate partner violence:    Fear of current or ex partner: Not on file    Emotionally abused: Not on file    Physically abused: Not on file    Forced sexual activity: Not on file  Other Topics Concern  . Not on file  Social History Narrative   Married to Ballinger. They have 3 children Alden Hipp, [name].   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   Drinks caffeine, takes herbal remedies, takes a daily vitamin.   Wear seatbelt, smoke detector in the home.   Exercises routinely.   Has a partial plate upper front teeth.   Feels safe in his relationships.   Allergies as of 02/13/2018      Reactions   Morphine And Related    anger      Medication List        Accurate as of 02/13/18 12:31 PM. Always use your most recent med list.          buPROPion 150 MG 12 hr tablet Commonly known as:  WELLBUTRIN SR Take 1 tablet (150 mg total) by mouth 2 (two) times daily.      All past medical history, surgical history, allergies, family history, immunizations andmedications were updated in the EMR today and reviewed under the history and medication portions of their EMR.     No results found for this or any previous visit (from the past 2160 hour(s)).  Patient was never admitted.   ROS: 14 pt review of systems performed and negative (unless mentioned in an HPI)  Objective: BP 120/78 (BP Location: Left Arm, Patient Position: Sitting,  Cuff Size: Normal)   Pulse (!) 58   Temp 98 F (36.7 C) (Oral)   Resp 16   Ht 6' (1.829 m)   Wt 200 lb (90.7 kg)   SpO2 97%   BMI 27.12 kg/m  Gen: Afebrile. No acute distress. Nontoxic in appearance, well-developed, well-nourished,  Caucasian male.  HENT: AT. [place]. Bilateral TM visualized and normal in appearance, normal external auditory  canal. MMM, no oral lesions, adequate dentition. Bilateral nares within normal limits. Throat without erythema, ulcerations or exudates. no Cough on exam, no hoarseness on exam. Eyes:Pupils Equal Round Reactive to light, Extraocular movements intact,  Conjunctiva without redness, discharge or icterus. Neck/lymp/endocrine: Supple,no lymphadenopathy, no thyromegaly CV: RRR no murmur, no edema, +2/4 P posterior tibialis pulses. no carotid bruits. No JVD. Chest: CTAB, no wheeze, rhonchi or crackles. normal Respiratory effort. good Air movement. Abd: Soft. flat. NTND. BS present. no Masses palpated. No hepatosplenomegaly. No rebound tenderness or guarding. Skin: no rashes, purpura or petechiae. Warm and well-perfused. Skin intact. Neuro/Msk: Normal gait. PERLA. EOMi. Alert. Oriented x3.  Cranial nerves II through XII intact. Muscle strength 5/5 upper/lower extremity. DTRs equal bilaterally. Psych: Normal affect, dress and demeanor. Normal speech. Normal thought content and judgment.  No exam data present  Assessment/plan: [name] is a 42 y.o. male present for CPE Tobacco use disorder He has zyban script. Has not started yet. He and his sig. Other are going to both quit together soon.  Overweight (BMI 25.0-29.9)/chest discomfort - Lipid panel - discussed heart health today. He has a family h/o in his father that is concerning him. He complained of intermittent right sided chest discomfort intermittently, that is reproducible with touch, consistent with MSK/costochondritis. Advised him ACS vs MSK/costochondiritis symptoms and appropriate care needed for  both. If felt to be chest pain related to heart --> must be seen in ED immediately.  Hyperkalemia - Basic Metabolic Panel (BMET) Diabetes mellitus screening - HgB A1c Screening for deficiency anemia - CBC Encounter for preventive health examination Patient was encouraged to exercise greater than 150 minutes a week. Patient was encouraged to choose a diet filled with fresh fruits and vegetables, and lean meats. AVS provided to patient today for education/recommendation on gender specific health and safety maintenance. Colonoscopy: Fhx in mother in her 5870s. Screen at 50.  Immunizations: tdap 2014 UTD, Influenza (encouraged yearly) Infectious disease screening: HIV acompleted PSA: No Fhx, screen at 50 discussion.   Return in about 1 year (around 02/14/2019) for CPE.  Note is dictated utilizing voice recognition software. Although note has been proof read prior to signing, occasional typographical errors still can be missed. If any questions arise, please do not hesitate to call for verification.  Electronically signed by: Richard Pacinienee Miel Wisener, DO [facility]

## 2018-03-25 ENCOUNTER — Telehealth: Payer: Self-pay | Admitting: Emergency Medicine

## 2018-03-25 NOTE — Telephone Encounter (Signed)
Appointment scheduled to evaluate toe nail 03/27/18 @ 3:30pm.   I scheduled patient for 30 minutes just in case it was needed.

## 2018-03-25 NOTE — Telephone Encounter (Signed)
Please advise pt to make an appt to be seen for his reported ingrown toenail. We would need to evaluate him for proper diagnosis and provide acute treatment if necesary, in order to place a referral since he has never been seen for this condition in the past.

## 2018-03-25 NOTE — Telephone Encounter (Signed)
Please advise 

## 2018-03-25 NOTE — Telephone Encounter (Signed)
Copied from CRM [id number]. Topic: Referral - Request >> Mar 25, 2018  2:20 PM Oneal GroutSebastian, Jennifer S wrote: Reason for CRM: Requesting referral to podiatrist. Patient has ingrown toenail left big toe.

## 2018-03-27 ENCOUNTER — Encounter: Payer: Self-pay | Admitting: Family Medicine

## 2018-03-27 ENCOUNTER — Ambulatory Visit (INDEPENDENT_AMBULATORY_CARE_PROVIDER_SITE_OTHER): Payer: BC Managed Care – PPO | Admitting: Family Medicine

## 2018-03-27 VITALS — BP 139/87 | HR 62 | Resp 16 | Ht 72.0 in | Wt 203.0 lb

## 2018-03-27 DIAGNOSIS — L600 Ingrowing nail: Secondary | ICD-10-CM | POA: Diagnosis not present

## 2018-03-27 DIAGNOSIS — L608 Other nail disorders: Secondary | ICD-10-CM | POA: Diagnosis not present

## 2018-03-27 MED ORDER — CLOBETASOL PROP EMOLLIENT BASE 0.05 % EX CREA: TOPICAL_CREAM | CUTANEOUS | 1 refills | Status: DC

## 2018-03-27 NOTE — Progress Notes (Signed)
[name] , Sep 30, 1975, 42 y.o., male MRN: [id number] Patient Care Team    Relationship Specialty Notifications Start End  [name], Renee A, DO PCP - General Family Medicine  02/12/17     Chief Complaint  Patient presents with  . Nail Problem    ingrown Left great toenail, Right little toenail     Subjective: Pt presents for an OV with complaints of left toe pain from nail and right 5th toe discomfort.  he reports he has "issues" with both toes for many years. However over the last month the left large toe pain is worse. He has to wear steal toed shoes for work and noticed that was making her left toe pain worse by pushing on it. He bought new boots that are not steal toed and it has improved some.The redness has resolved, but pain still remains to touch. He states he should wear steal toe shoes for safety. He would like referral to a podiatrist to discuss management of his toenails.    [facility] 02/13/2018 02/13/2018 02/12/2017  Decreased Interest 0 0 0  Down, Depressed, Hopeless 0 0 0  PHQ - 2 Score 0 0 0  Altered sleeping 1 - -  Change in appetite 0 - -  Feeling bad or failure about yourself  0 - -  Trouble concentrating 0 - -  Moving slowly or fidgety/restless 0 - -  Suicidal thoughts 0 - -  PHQ-9 Score 1 - -  Difficult doing work/chores Not difficult at all - -    Allergies  Allergen Reactions  . Morphine And Related     anger   Social History   Tobacco Use  . Smoking status: Current Every Day Smoker    Packs/day: 1.00    Years: 26.00    Pack years: 26.00    Types: Cigarettes  . Smokeless tobacco: Never Used  Substance Use Topics  . Alcohol use: Yes    Alcohol/week: 2.0 standard drinks    Types: 2 Cans of beer per week   No past medical history on file. Past Surgical History:  Procedure Laterality Date  . NO PAST SURGERIES     Family History  Problem Relation Age of Onset  . Ovarian cancer Mother   . Colon cancer Mother   . Heart  disease Mother   . Bladder Cancer Father   . Hearing loss Father   . Heart disease Father   . Early death Father   . Breast cancer Maternal Grandmother   . Arthritis Maternal Grandmother   . Mental retardation Maternal Grandmother   . Heart disease Maternal Grandmother   . Hearing loss Maternal Grandmother   . Alcohol abuse Maternal Grandfather   . Mental retardation Maternal Grandfather   . Early death Maternal Grandfather   . Arthritis Paternal Grandmother   . Heart disease Paternal Grandfather    Allergies as of 03/27/2018      Reactions   Morphine And Related    anger      Medication List        Accurate as of 03/27/18  3:52 PM. Always use your most recent med list.          buPROPion 150 MG 12 hr tablet Commonly known as:  WELLBUTRIN SR Take 1 tablet (150 mg total) by mouth 2 (two) times daily.       All past medical history, surgical history, allergies, family history, immunizations andmedications were updated in the EMR today and  reviewed under the history and medication portions of their EMR.     ROS: Negative, with the exception of above mentioned in HPI   Objective:  BP 139/87 (BP Location: Right Arm, Patient Position: Sitting, Cuff Size: Normal)   Pulse 62   Resp 16   Ht 6' (1.829 m)   Wt 203 lb (92.1 kg)   SpO2 98%   BMI 27.53 kg/m  Body mass index is 27.53 kg/m. Gen: Afebrile. No acute distress. Nontoxic in appearance, well developed, well nourished.  MSK (feet/toes): no erythema, no soft tissue swelling. TTP medial left large toe nail fold. No drainage. Bilateral 5th toes with excess lateral nail.  Neuro:  Normal gait. PERLA. EOMi. Alert. Oriented x3  No exam data present No results found. No results found for this or any previous visit (from the past 24 hour(s)).  Assessment/Plan: [name] is a 42 y.o. male present for OV for  Ingrown nail of great toe of left foot/nail deformity Mild ingrown toenail left large toe. No signs of  infection or bleeding today. He is tender to touch along medial nail fold of left large toe. Discussed different options of management with him for acute care until able to see podiatry. Epson salt soaks, steroid cream, toe guards, nail care etc.  - 5th toe nail appears to be a deformity --> excess nail present laterally of original nail bed.  - Ambulatory referral to Podiatry - Clobetasol Prop Emollient Base (CLOBETASOL PROPIONATE E) 0.05 % emollient cream; Apply cream to toenail fold twice a day after shower.  Dispense: 30 g; Refill: 1 - F/U PRN     Reviewed expectations re: course of current medical issues.  Discussed self-management of symptoms.  Outlined signs and symptoms indicating need for more acute intervention.  Patient verbalized understanding and all questions were answered.  Patient received an After-Visit Summary.    No orders of the defined types were placed in this encounter.    Note is dictated utilizing voice recognition software. Although note has been proof read prior to signing, occasional typographical errors still can be missed. If any questions arise, please do not hesitate to call for verification.   electronically signed by:  Felix Pacini, DO  [facility] - [place]

## 2018-03-27 NOTE — Patient Instructions (Signed)
1. Apply steroid cream to large left toe fold after showers or soaking. 2. Soak once a day if able in warm water an epson salt. 3. Try to look for the soft toe shields (they are rubbery soft silicone type- [employer]ast has them)   I referred you to podiatry, they will call to schedule--> do the above until they can get you in.   Ingrown Toenail An ingrown toenail occurs when the corner or sides of your toenail grow into the surrounding skin. The big toe is most commonly affected, but it can happen to any of your toes. If your ingrown toenail is not treated, you will be at risk for infection. What are the causes? This condition may be caused by:  Wearing shoes that are too small or tight.  Injury or trauma, such as stubbing your toe or having your toe stepped on.  Improper cutting or care of your toenails.  Being born with (congenital) nail or foot abnormalities, such as having a nail that is too big for your toe.  What increases the risk? Risk factors for an ingrown toenail include:  Age. Your nails tend to thicken as you get older, so ingrown nails are more common in older people.  Diabetes.  Cutting your toenails incorrectly.  Blood circulation problems.  What are the signs or symptoms? Symptoms may include:  Pain, soreness, or tenderness.  Redness.  Swelling.  Hardening of the skin surrounding the toe.  Your ingrown toenail may be infected if there is fluid, pus, or drainage. How is this diagnosed? An ingrown toenail may be diagnosed by medical history and physical exam. If your toenail is infected, your health care provider may test a sample of the drainage. How is this treated? Treatment depends on the severity of your ingrown toenail. Some ingrown toenails may be treated at home. More severe or infected ingrown toenails may require surgery to remove all or part of the nail. Infected ingrown toenails may also be treated with antibiotic medicines. Follow these instructions  at home:  If you were prescribed an antibiotic medicine, finish all of it even if you start to feel better.  Soak your foot in warm soapy water for 20 minutes, 3 times per day or as directed by your health care provider.  Carefully lift the edge of the nail away from the sore skin by wedging a small piece of cotton under the corner of the nail. This may help with the pain. Be careful not to cause more injury to the area.  Wear shoes that fit well. If your ingrown toenail is causing you pain, try wearing sandals, if possible.  Trim your toenails regularly and carefully. Do not cut them in a curved shape. Cut your toenails straight across. This prevents injury to the skin at the corners of the toenail.  Keep your feet clean and dry.  If you are having trouble walking and are given crutches by your health care provider, use them as directed.  Do not pick at your toenail or try to remove it yourself.  Take medicines only as directed by your health care provider.  Keep all follow-up visits as directed by your health care provider. This is important. Contact a health care provider if:  Your symptoms do not improve with treatment. Get help right away if:  You have red streaks that start at your foot and go up your leg.  You have a fever.  You have increased redness, swelling, or pain.  You have  fluid, blood, or pus coming from your toenail. This information is not intended to replace advice given to you by your health care provider. Make sure you discuss any questions you have with your health care provider. Document Released: 07/19/2000 Document Revised: 12/22/2015 Document Reviewed: 06/15/2014 Elsevier Interactive Patient Education  [employer]2018 Elsevier Inc.

## 2018-03-30 ENCOUNTER — Encounter: Payer: Self-pay | Admitting: Family Medicine

## 2018-04-02 ENCOUNTER — Encounter: Payer: Self-pay | Admitting: Podiatry

## 2018-04-02 ENCOUNTER — Ambulatory Visit: Payer: BC Managed Care – PPO | Admitting: Podiatry

## 2018-04-02 ENCOUNTER — Ambulatory Visit (INDEPENDENT_AMBULATORY_CARE_PROVIDER_SITE_OTHER): Payer: BC Managed Care – PPO

## 2018-04-02 VITALS — BP 144/86 | HR 55 | Resp 16

## 2018-04-02 DIAGNOSIS — L600 Ingrowing nail: Secondary | ICD-10-CM

## 2018-04-02 DIAGNOSIS — Q828 Other specified congenital malformations of skin: Secondary | ICD-10-CM

## 2018-04-02 DIAGNOSIS — M898X7 Other specified disorders of bone, ankle and foot: Secondary | ICD-10-CM

## 2018-04-02 NOTE — Progress Notes (Signed)
  Subjective:  Patient ID: [name], male    DOB: 1976/04/26,  MRN: [id number] HPI Chief Complaint  Patient presents with  . Toe Pain    Hallux left - medial border x few weeks, tender with shoes  . Toe Pain    5th toe right - lateral border, thickened nail or skin, tender at times especially with shoes  . New Patient (Initial Visit)    42 y.o. male presents with the above complaint.   ROS: Denies fever chills nausea vomiting muscle aches pains calf pain back pain chest pain shortness of breath.  No past medical history on file. Past Surgical History:  Procedure Laterality Date  . NO PAST SURGERIES      Current Outpatient Medications:  .  buPROPion (WELLBUTRIN SR) 150 MG 12 hr tablet, Take 1 tablet (150 mg total) by mouth 2 (two) times daily. (Patient not taking: Reported on 02/13/2018), Disp: 60 tablet, Rfl: 1 .  Clobetasol Prop Emollient Base (CLOBETASOL PROPIONATE E) 0.05 % emollient cream, Apply cream to toenail fold twice a day after shower., Disp: 30 g, Rfl: 1  Allergies  Allergen Reactions  . Morphine And Related     anger   Review of Systems Objective:   Vitals:   04/02/18 0909  BP: (!) 144/86  Pulse: (!) 55  Resp: 16    General: Well developed, nourished, in no acute distress, alert and oriented x3   Dermatological: Skin is warm, dry and supple bilateral. Nails x 10 are well maintained; remaining integument appears unremarkable at this time. There are no open sores, no preulcerative lesions, no rash or signs of infection present.  Listers corn fifth digit right foot no signs of infection.  Sharp incurvated nail margin tibial border hallux left no infection.  Vascular: Dorsalis Pedis artery and Posterior Tibial artery pedal pulses are 2/4 bilateral with immedate capillary fill time. Pedal hair growth present. No varicosities and no lower extremity edema present bilateral.   Neruologic: Grossly intact via light touch bilateral. Vibratory intact via tuning  fork bilateral. Protective threshold with Semmes Wienstein monofilament intact to all pedal sites bilateral. Patellar and Achilles deep tendon reflexes 2+ bilateral. No Babinski or clonus noted bilateral.   Musculoskeletal: No gross boney pedal deformities bilateral. No pain, crepitus, or limitation noted with foot and ankle range of motion bilateral. Muscular strength 5/5 in all groups tested bilateral.  Gait: Unassisted, Nonantalgic.    Radiographs:  None taken  Assessment & Plan:   Assessment: Ingrown toenail tibial border hallux left small hangnail type growth or listers corn to the fibular border fifth digit right foot.  Plan: Discussed the need for matrixectomy.  He states that he has too much work to do right now for this and will follow-up with me in the near future.     [name] T. GreenviewHyatt, North DakotaDPM

## 2018-04-14 ENCOUNTER — Encounter: Payer: Self-pay | Admitting: Family Medicine

## 2018-04-23 ENCOUNTER — Encounter: Payer: Self-pay | Admitting: Podiatry

## 2018-04-23 ENCOUNTER — Ambulatory Visit (INDEPENDENT_AMBULATORY_CARE_PROVIDER_SITE_OTHER): Payer: BC Managed Care – PPO | Admitting: Podiatry

## 2018-04-23 DIAGNOSIS — L600 Ingrowing nail: Secondary | ICD-10-CM

## 2018-04-23 MED ORDER — NEOMYCIN-POLYMYXIN-HC 3.5-10000-1 OT SOLN: OTIC | 0 refills | Status: DC

## 2018-04-23 NOTE — Progress Notes (Signed)
Presents today chief complaint of pain to the fibular border of the fifth digit of the right foot and the medial border of the hallux left.  States that they have been bother me and like to have them removed.  Objective: Vital signs are stable he is alert and oriented x3.  Pulses are palpable.  Neurologic sensorium is intact.  Degenerative flexors are intact muscle strength is normal symmetrical.  Sharp incurvated nail margin along the fibular border of the fifth digit of the right foot and tibial border of the hallux of the left foot.  Mild erythema no edema cellulitis drainage or odor.  Assessment: Ingrown toenails fifth right.  Hallux left.  Plan: Discussed etiology pathology and surgical therapies chemical matrixectomy's were performed to the sites after local anesthetic was administered.  Tolerated procedure well without complications.  They were given both oral and written home-going instruction for the care and soaking of the toe as well as a prescription for Cortisporin Otic to be applied twice daily after soaking.  I will follow-up with him in 2 weeks to make sure that he is doing well.

## 2018-04-23 NOTE — Patient Instructions (Signed)

## 2018-05-14 ENCOUNTER — Ambulatory Visit: Payer: BC Managed Care – PPO

## 2018-05-14 DIAGNOSIS — L600 Ingrowing nail: Secondary | ICD-10-CM

## 2018-05-14 NOTE — Patient Instructions (Signed)

## 2018-05-15 NOTE — Progress Notes (Signed)
Patient is here today for follow-up appointment, procedure performed 04/23/2018, removal of ingrown toenails hallux left, fifth right.  He states today that the areas do not hurt, they do not bother him and he feels like he is having no issues with them healing.  Noted well-healing areas no erythema, no drainage, no swelling, no redness, no other signs and symptoms of infection.  Areas have scabbed over and are healing really well.  We discussed signs and symptoms of infection.  Verbal and written instructions were given to patient.  He is to follow-up with any acute symptom changes.

## 2019-03-02 ENCOUNTER — Encounter: Payer: BC Managed Care – PPO | Admitting: Family Medicine

## 2019-03-03 ENCOUNTER — Encounter: Payer: BC Managed Care – PPO | Admitting: Family Medicine

## 2019-03-09 ENCOUNTER — Ambulatory Visit (INDEPENDENT_AMBULATORY_CARE_PROVIDER_SITE_OTHER): Payer: BC Managed Care – PPO | Admitting: Family Medicine

## 2019-03-09 ENCOUNTER — Encounter: Payer: Self-pay | Admitting: Family Medicine

## 2019-03-09 ENCOUNTER — Other Ambulatory Visit: Payer: Self-pay

## 2019-03-09 VITALS — BP 118/84 | HR 58 | Temp 98.0°F | Resp 18 | Ht 73.0 in | Wt 216.0 lb

## 2019-03-09 DIAGNOSIS — Z0000 Encounter for general adult medical examination without abnormal findings: Secondary | ICD-10-CM | POA: Diagnosis not present

## 2019-03-09 DIAGNOSIS — E875 Hyperkalemia: Secondary | ICD-10-CM

## 2019-03-09 DIAGNOSIS — F17200 Nicotine dependence, unspecified, uncomplicated: Secondary | ICD-10-CM | POA: Diagnosis not present

## 2019-03-09 DIAGNOSIS — Z131 Encounter for screening for diabetes mellitus: Secondary | ICD-10-CM | POA: Diagnosis not present

## 2019-03-09 DIAGNOSIS — E663 Overweight: Secondary | ICD-10-CM | POA: Diagnosis not present

## 2019-03-09 DIAGNOSIS — Z130 Encounter for screening for diseases of the blood and blood-forming organs and certain disorders involving the immune mechanism: Secondary | ICD-10-CM

## 2019-03-09 DIAGNOSIS — L309 Dermatitis, unspecified: Secondary | ICD-10-CM

## 2019-03-09 LAB — CBC
HCT: 50 % (ref 39.0–52.0)
Hemoglobin: 16.8 g/dL (ref 13.0–17.0)
MCHC: 33.5 g/dL (ref 30.0–36.0)
MCV: 92.4 fl (ref 78.0–100.0)
Platelets: 285 10*3/uL (ref 150.0–400.0)
RBC: 5.42 Mil/uL (ref 4.22–5.81)
RDW: 12.5 % (ref 11.5–15.5)
WBC: 9.5 10*3/uL (ref 4.0–10.5)

## 2019-03-09 LAB — LIPID PANEL
Cholesterol: 183 mg/dL (ref 0–200)
HDL: 42.2 mg/dL (ref >39.00)
LDL Cholesterol: 120 mg/dL — ABNORMAL HIGH (ref 0–99)
NonHDL: 140.41
Total CHOL/HDL Ratio: 4
Triglycerides: 104 mg/dL (ref 0.0–149.0)
VLDL: 20.8 mg/dL (ref 0.0–40.0)

## 2019-03-09 LAB — COMPREHENSIVE METABOLIC PANEL
ALT: 23 U/L (ref 0–53)
AST: 15 U/L (ref 0–37)
Albumin: 4.6 g/dL (ref 3.5–5.2)
Alkaline Phosphatase: 67 U/L (ref 39–117)
BUN: 19 mg/dL (ref 6–23)
CO2: 23 mEq/L (ref 19–32)
Calcium: 9.2 mg/dL (ref 8.4–10.5)
Chloride: 109 mEq/L (ref 96–112)
Creatinine, Ser: 1.15 mg/dL (ref 0.40–1.50)
GFR: 69.28 mL/min (ref >60.00)
Glucose, Bld: 98 mg/dL (ref 70–99)
Potassium: 4.8 mEq/L (ref 3.5–5.1)
Sodium: 140 mEq/L (ref 135–145)
Total Bilirubin: 0.4 mg/dL (ref 0.2–1.2)
Total Protein: 6.6 g/dL (ref 6.0–8.3)

## 2019-03-09 LAB — HEMOGLOBIN A1C: Hgb A1c MFr Bld: 5.4 % (ref 4.6–6.5)

## 2019-03-09 MED ORDER — AMLODIPINE BESYLATE 2.5 MG PO TABS: 2.5000 mg | ORAL_TABLET | Freq: Every day | ORAL | 1 refills | Status: DC

## 2019-03-09 NOTE — Progress Notes (Signed)
Patient ID: [name], male  DOB: 11-Jul-1976, 43 y.o.   MRN: [id number] Patient Care Team    Relationship Specialty Notifications Start End  [name], Epsie Walthall A, DO PCP - General Family Medicine  02/12/17     Chief Complaint  Patient presents with   Annual Exam    Fasting. No complaints     Subjective:  Richard Glatterimothy Marcon is a 43 y.o.  Male  present for CPE. All past medical history, surgical history, allergies, family history, immunizations, medications and social history were updated in the electronic medical record today. All recent labs, ED visits and hospitalizations within the last year were reviewed.   Smoking: tried zyban- unsuccessful. Does not desire t otry again yet   dermatitis hands:  Patient reports over the last year he has noticed small pimples, dry skin and rash surrounding his palms and fingers.  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  So he has been making sure to clean the steering wheels before entering the vehicles.  This seems to have improved his condition a small amount.  Health maintenance:  Colonoscopy:Fhx in mother in her 5170s. Screen at 50. Immunizations: [id number] UTD, Influenza (encouraged yearly) Infectious disease screening: HIVcompleted PSA:No Fhx, screen at 50 discussion Assistive device: none Oxygen WUJ:WJXBuse:none Patient has a Dental home. Hospitalizations/ED visits: reviewed  [facility] 03/09/2019 02/13/2018 02/13/2018 02/12/2017  Decreased Interest 0 0 0 0  Down, Depressed, Hopeless 0 0 0 0  PHQ - 2 Score 0 0 0 0  Altered sleeping - 1 - -  Change in appetite - 0 - -  Feeling bad or failure about yourself  - 0 - -  Trouble concentrating - 0 - -  Moving slowly or fidgety/restless - 0 - -  Suicidal thoughts - 0 - -  PHQ-9 Score - 1 - -  Difficult doing work/chores - Not difficult at all - -   GAD 7 : Generalized Anxiety Score 02/13/2018  Nervous, Anxious, on Edge 1    Control/stop worrying 0  Worry too much - different things 0  Trouble relaxing 1  Restless 2  Easily annoyed or irritable 1  Afraid - awful might happen 0  Total GAD 7 Score 5  Anxiety Difficulty Not difficult at all    There is no immunization history on file for this patient.   History reviewed. No pertinent past medical history. Allergies  Allergen Reactions   Morphine And Related     anger   Past Surgical History:  Procedure Laterality Date   NO PAST SURGERIES     Family History  Problem Relation Age of Onset   Ovarian cancer Mother    Colon cancer Mother    Heart disease Mother    Bladder Cancer Father    Hearing loss Father    Heart disease Father    Early death Father    Breast cancer Maternal Grandmother    Arthritis Maternal Grandmother    Mental retardation Maternal Grandmother    Heart disease Maternal Grandmother    Hearing loss Maternal Grandmother    Alcohol abuse Maternal Grandfather    Mental retardation Maternal Grandfather    Early death Maternal Grandfather    Arthritis Paternal Grandmother    Heart disease Paternal Grandfather    Social History   Social History Narrative   Married to [place]achel. They have 3 children Alden HippLiam, Haydn, [name].   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   Drinks caffeine, takes herbal remedies, takes a daily vitamin.   Wear seatbelt, smoke detector in the home.   Exercises routinely.   Has a partial plate upper front teeth.   Feels safe in his relationships.    Allergies as of 03/09/2019      Reactions   Morphine And Related    anger      Medication List       Accurate as of March 09, 2019  9:39 AM. If you have any questions, ask your nurse or doctor.        STOP taking these medications   buPROPion 150 MG 12 hr tablet Commonly known as: WELLBUTRIN SR Stopped by: Felix Pacinienee Vlasta Baskin, DO   neomycin-polymyxin-hydrocortisone OTIC solution Commonly known as: CORTISPORIN Stopped by: Felix Pacinienee  Garek Schuneman, DO     TAKE these medications   Clobetasol Prop Emollient Base 0.05 % emollient cream Commonly known as: Clobetasol Propionate E Apply cream to toenail fold twice a day after shower.   MULTIVITAMIN ADULTS PO Take 1 tablet by mouth daily.       All past medical history, surgical history, allergies, family history, immunizations andmedications were updated in the EMR today and reviewed under the history and medication portions of their EMR.     No results found for this or any previous visit (from the past 2160 hour(s)).  Patient was never admitted.   ROS: 14 pt review of systems performed and negative (unless mentioned in an HPI)  Objective: BP 118/84 (BP Location: Left Arm, Patient Position: Sitting, Cuff Size: Normal)    Pulse (!) 58    Temp 98 F (36.7 C) (Temporal)    Resp 18    Ht 6\' 1"  (1.854 m)    Wt 216 lb (98 kg)    SpO2 97%    BMI 28.50 kg/m  Gen: Afebrile. No acute distress. Nontoxic in appearance, well-developed, well-nourished, pleasant, Caucasian male.  Mildly overweight. HENT: AT. [place]. Bilateral TM visualized and normal in appearance, normal external auditory canal. MMM, no oral lesions, adequate dentition. Bilateral nares within normal limits. Throat without erythema, ulcerations or exudates.  No cough on exam, no hoarseness on exam. Eyes:Pupils Equal Round Reactive to light, Extraocular movements intact,  Conjunctiva without redness, discharge or icterus. Neck/lymp/endocrine: Supple, no lymphadenopathy, no thyromegaly CV: RRR no murmur, no edema, +2/4 P posterior tibialis pulses.  No carotid bruits. No JVD. Chest: CTAB, no wheeze, rhonchi or crackles.  Normal respiratory effort.  Good air movement. Abd: Soft.  Flat. NTND. BS present.  No masses palpated. No hepatosplenomegaly. No rebound tenderness or guarding. Skin: Dry scaly/callus palms and fingers.  No erythema.  No open wounds or rash.  No purpura or petechiae. Warm and well-perfused. Skin  intact. Neuro/Msk:  Normal gait. PERLA. EOMi. Alert. Oriented x3.  Cranial nerves II through XII intact. Muscle strength 5/5 upper/lower extremity. DTRs equal bilaterally. Psych: Normal affect, dress and demeanor. Normal speech. Normal thought content and judgment.  No exam data present  Assessment/plan: Richard Glatterimothy Silvera is a 43 y.o. male present for CPE Overweight (BMI 25.0-29.9) Mildly overweight.  Discussed his weight with him today last year around this time he was approximately 203.  Encouraged him to increase his exercise and eat a healthy diet. - Lipid panel Hyperkalemia - Comprehensive metabolic panel Tobacco use disorder -He recently tried Zyban, but was unable to successfully quit.  He declines counseling at this time Diabetes mellitus screening - Hemoglobin A1c Screening for deficiency anemia -  CBC Dermatitis Dermatitis bilateral palms.  Could be a pustular/palmar eczema.  Today's exams mostly callus formations and dry skin.  Encouraged him to avoid alcohol based hand sanitizer use, exposures to chemicals and use Dove sensitive soap.  Encouraged use of Cetaphil daily after showers and/or bag balm treatment.  Could also consider fungal as possible etiology.   Follow-up if needed. Encounter for preventive health examination Patient was encouraged to exercise greater than 150 minutes a week. Patient was encouraged to choose a diet filled with fresh fruits and vegetables, and lean meats. AVS provided to patient today for education/recommendation on gender specific health and safety maintenance. Colonoscopy:Fhx in mother in her 41s. Screen at 50. Immunizations: [id number] UTD, Influenza (encouraged yearly) Infectious disease screening: HIVcompleted PSA:No Fhx, screen at 50 discussion  Return in about 1 year (around 03/08/2020) for CPE (30 min).  Electronically signed by: Howard Pouch, DO [place]

## 2019-03-09 NOTE — Patient Instructions (Signed)

## 2019-06-10 ENCOUNTER — Telehealth: Payer: Self-pay | Admitting: Family Medicine

## 2019-06-10 NOTE — Telephone Encounter (Signed)
Office received a call on 11/3 from pt regarding a bill that had been turned over to collections in the amount of $42.00 from [facility] 02/13/18.  After researching the billing inquiry, this was for a co-pay for that DOS where the pt came in for his annual CPE, but also discussed chest pain with the provider, thus creating a co-pay charge.  I called the pt today and left a detailed message on his VM letting him know unfortunately it is too late for [place] to do anything about this charge at this point and it is owed.

## 2021-10-15 ENCOUNTER — Telehealth: Payer: Self-pay | Admitting: Family Medicine

## 2021-10-15 ENCOUNTER — Encounter: Payer: Self-pay | Admitting: Family Medicine

## 2021-10-15 ENCOUNTER — Ambulatory Visit (HOSPITAL_BASED_OUTPATIENT_CLINIC_OR_DEPARTMENT_OTHER)
Admission: RE | Admit: 2021-10-15 | Discharge: 2021-10-15 | Disposition: A | Discharge status: Home / Self Care | Payer: 59 | Source: Ambulatory Visit | Attending: Family Medicine | Admitting: Family Medicine

## 2021-10-15 ENCOUNTER — Ambulatory Visit (INDEPENDENT_AMBULATORY_CARE_PROVIDER_SITE_OTHER): Payer: 59 | Admitting: Family Medicine

## 2021-10-15 ENCOUNTER — Other Ambulatory Visit: Payer: Self-pay

## 2021-10-15 VITALS — BP 124/74 | HR 60 | Temp 98.7°F | Ht 73.0 in | Wt 224.0 lb

## 2021-10-15 DIAGNOSIS — R202 Paresthesia of skin: Secondary | ICD-10-CM

## 2021-10-15 DIAGNOSIS — G8929 Other chronic pain: Secondary | ICD-10-CM | POA: Diagnosis not present

## 2021-10-15 DIAGNOSIS — M25511 Pain in right shoulder: Secondary | ICD-10-CM

## 2021-10-15 DIAGNOSIS — R200 Anesthesia of skin: Secondary | ICD-10-CM

## 2021-10-15 DIAGNOSIS — M4722 Other spondylosis with radiculopathy, cervical region: Secondary | ICD-10-CM

## 2021-10-15 NOTE — Telephone Encounter (Signed)
Please call patient: ?Shoulder x-ray is normal.  Suspect shoulder symptoms are either from rotator cuff injury or radiation from neck findings. 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  The above-mentioned levels are where the nerve exits to provide sensation to the shoulder, arm and part of hand -which is the locations he is having numbness and tingling when it is present.  This C5-6 level is the shoulder level and is the worst area of osteophytes. ? ? ?Options are: ?Referral to orthopedics to further evaluate the shoulder for possible rotator cuff causes of his pain. ? ?And/or ? ?Referral to neurology for further evaluation of his degeneration of his neck.  Depending upon their findings they likely would order an MRI, possible nerve conduction studies and offer treatment options depending upon those findings. ? ?I would recommend neurology first, there is a strong possibility the discomfort he is feeling in his shoulders is caused by the neck findings. ? ? ?Please advised on his decision and I will place referrals for him. ?
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
## 2021-10-16 NOTE — Telephone Encounter (Signed)
LVM for pt to CB regarding results.  

## 2021-10-16 NOTE — Telephone Encounter (Signed)
Patient calling in regards to xray results.  Please call patient [phone] ?

## 2021-10-17 DIAGNOSIS — M4722 Other spondylosis with radiculopathy, cervical region: Secondary | ICD-10-CM | POA: Insufficient documentation

## 2021-10-17 NOTE — Addendum Note (Signed)
Addended by: Maxie Barb on: 10/17/2021 09:27 AM ? ? Modules accepted: Orders ? ?

## 2021-10-17 NOTE — Telephone Encounter (Signed)
Spoke with pt regarding labs and instructions. Pt agreed to referral for neuro. Order pending.  ?

## 2021-10-17 NOTE — Addendum Note (Signed)
Addended by: Felix Pacini A on: 10/17/2021 12:48 PM ? ? Modules accepted: Orders ? ?

## 2021-10-18 ENCOUNTER — Telehealth: Payer: Self-pay | Admitting: Family Medicine

## 2021-10-18 NOTE — Telephone Encounter (Signed)
New referral placed to [facility]- this was the original requested location. ?

## 2024-01-24 IMAGING — CR DG CERVICAL SPINE COMPLETE 4+V
5 series · 5 of 5 positions shown · non-contrast
Comparison: None.

CLINICAL DATA: Right shoulder, arm and neck pain

EXAM:
CERVICAL SPINE - COMPLETE 4+ VIEW

[w c-spine lat]
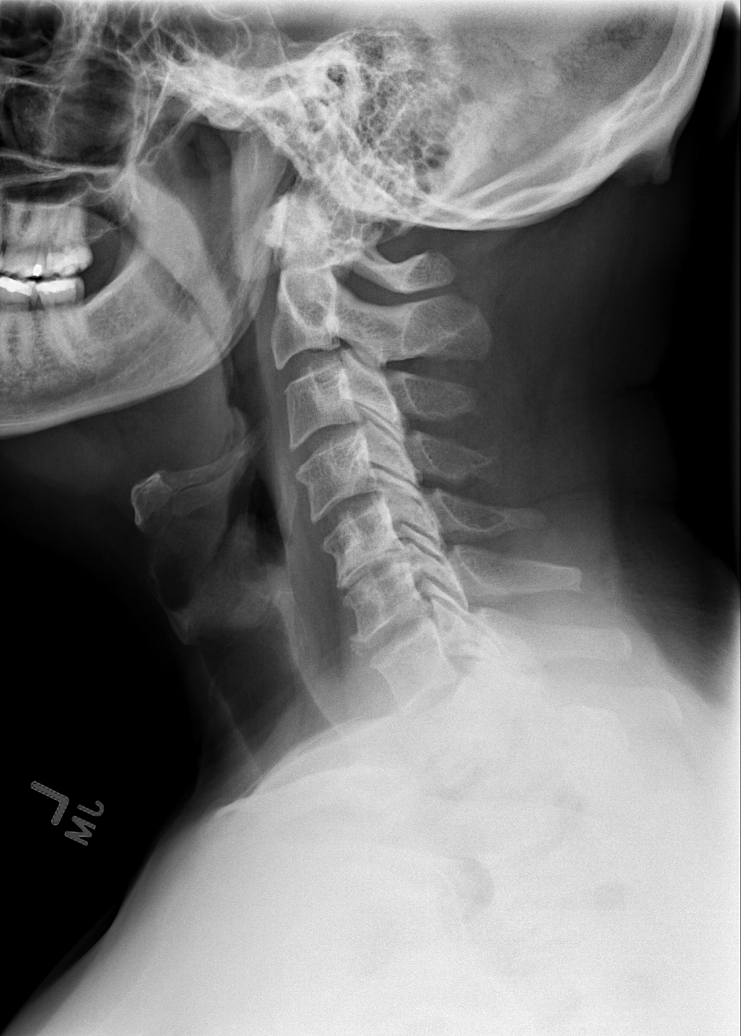

[w c-spine oblique (1 of 2)]
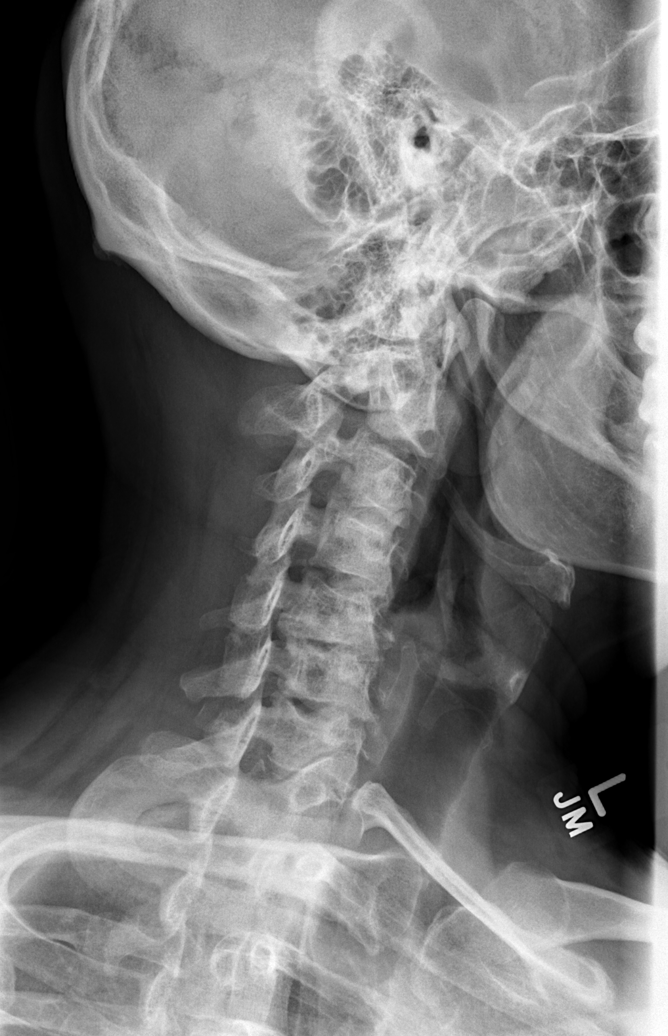

[w c-spine oblique (2 of 2)]
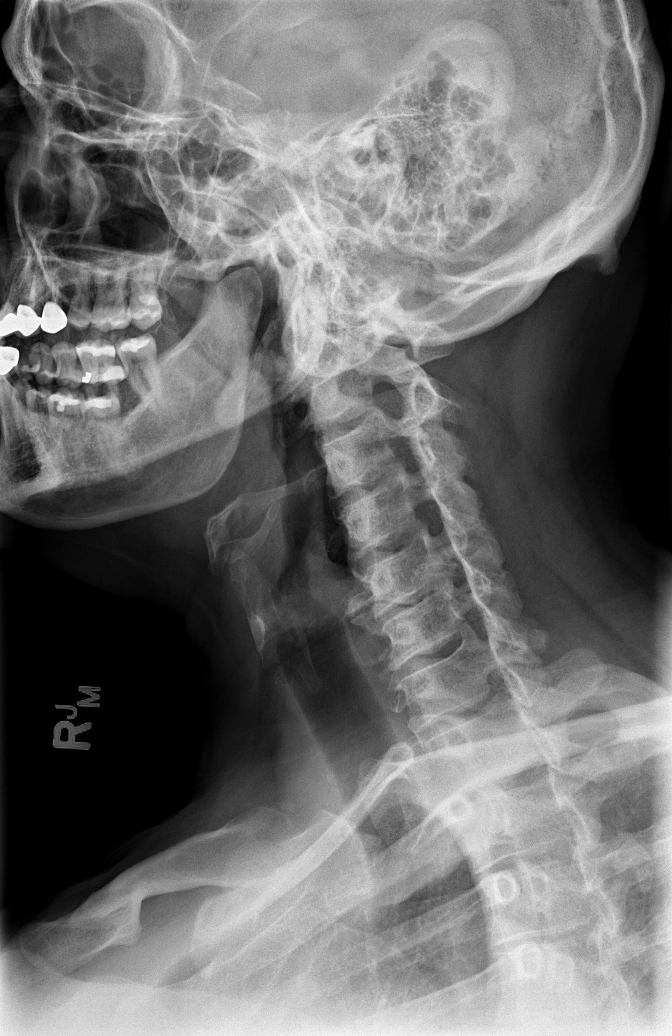

[w c-spine a.p.]
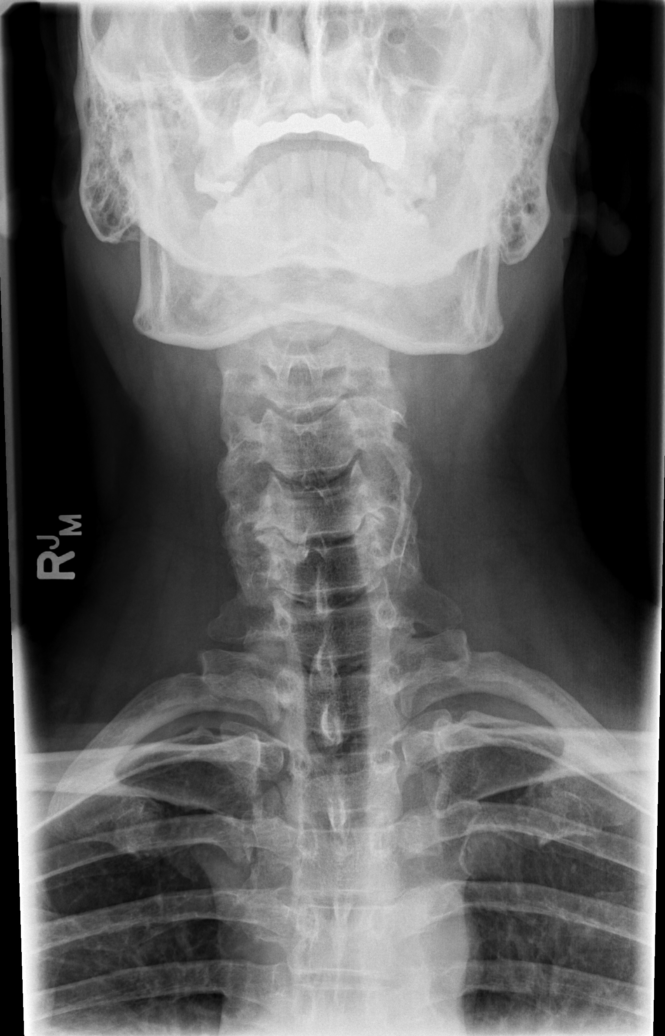

[w c-spine odontoid]
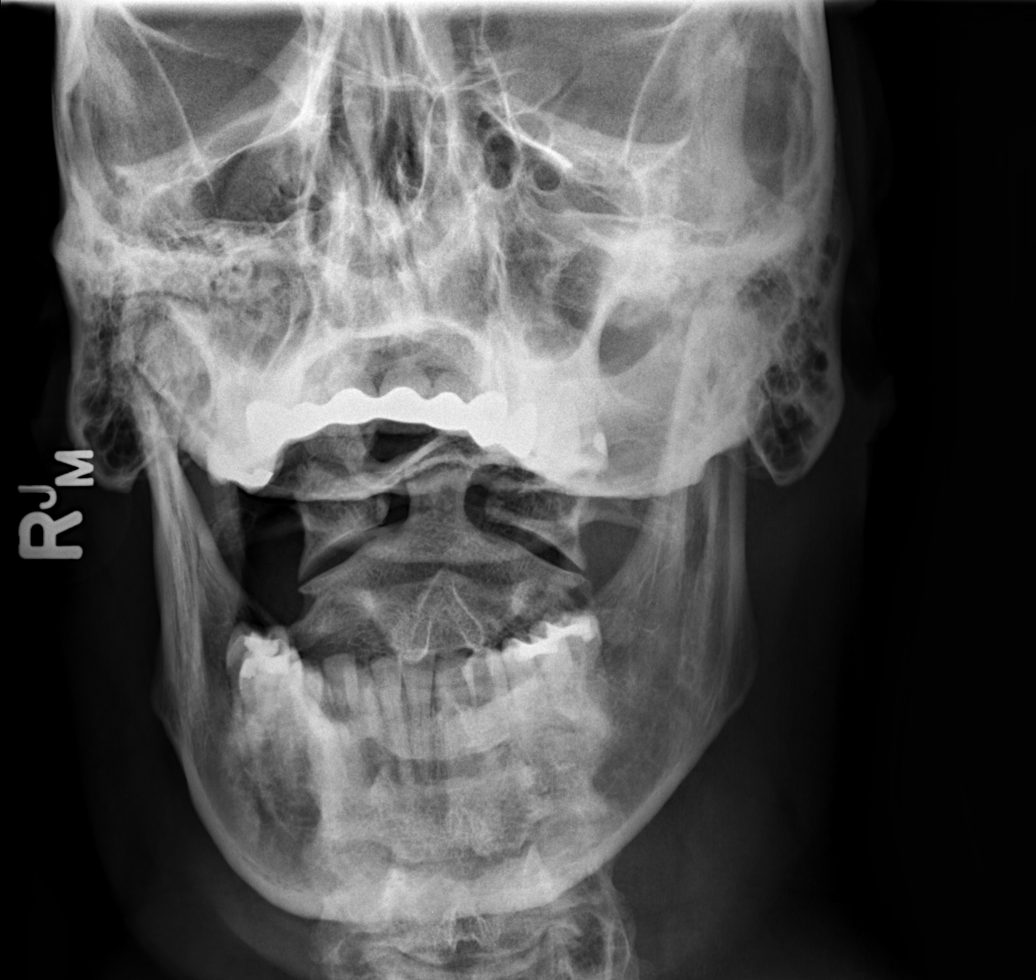

[5 of 5 positions shown; findings below may reference images not displayed]

FINDINGS: Normal alignment without subluxation or dislocation. Facets are
aligned. Preserved vertebral body heights. Normal prevertebral soft
tissues. Moderate cervical degenerative disc disease at C5-6 and
C6-7 with disc space narrowing, sclerosis and endplate osteophytes.
C5-6 is the most severe level. Facets are aligned. Some degree of
mild foraminal bony encroachment at C5-6 bilaterally on the oblique
views. Intact odontoid. Trachea midline. Lung apices are clear.
IMPRESSION: C5-6 and C6-7 degenerative changes as above. No acute finding by
plain radiography.
